# Patient Record
Sex: Female | Born: 1990 | Race: Black or African American | Hispanic: No | Marital: Married | Smoking: Never smoker
Health system: Southern US, Community
[De-identification: ages and names within clinical notes are randomized; demographics above are authoritative.]

---

## 2019-06-27 ENCOUNTER — Ambulatory Visit (HOSPITAL_COMMUNITY)
Admission: EM | Admit: 2019-06-27 | Discharge: 2019-06-27 | Disposition: A | Discharge status: Home / Self Care | Attending: Family Medicine | Admitting: Family Medicine

## 2019-06-27 ENCOUNTER — Other Ambulatory Visit: Payer: Self-pay

## 2019-06-27 ENCOUNTER — Encounter (HOSPITAL_COMMUNITY): Payer: Self-pay

## 2019-06-27 DIAGNOSIS — Z20828 Contact with and (suspected) exposure to other viral communicable diseases: Secondary | ICD-10-CM | POA: Insufficient documentation

## 2019-06-27 DIAGNOSIS — Z3202 Encounter for pregnancy test, result negative: Secondary | ICD-10-CM

## 2019-06-27 DIAGNOSIS — Z793 Long term (current) use of hormonal contraceptives: Secondary | ICD-10-CM | POA: Diagnosis not present

## 2019-06-27 DIAGNOSIS — R110 Nausea: Secondary | ICD-10-CM | POA: Insufficient documentation

## 2019-06-27 DIAGNOSIS — B9789 Other viral agents as the cause of diseases classified elsewhere: Secondary | ICD-10-CM | POA: Diagnosis not present

## 2019-06-27 DIAGNOSIS — R109 Unspecified abdominal pain: Secondary | ICD-10-CM | POA: Diagnosis not present

## 2019-06-27 DIAGNOSIS — Z791 Long term (current) use of non-steroidal anti-inflammatories (NSAID): Secondary | ICD-10-CM | POA: Insufficient documentation

## 2019-06-27 DIAGNOSIS — R0981 Nasal congestion: Secondary | ICD-10-CM | POA: Diagnosis present

## 2019-06-27 DIAGNOSIS — R05 Cough: Secondary | ICD-10-CM

## 2019-06-27 DIAGNOSIS — J069 Acute upper respiratory infection, unspecified: Secondary | ICD-10-CM | POA: Diagnosis not present

## 2019-06-27 DIAGNOSIS — Z79899 Other long term (current) drug therapy: Secondary | ICD-10-CM | POA: Insufficient documentation

## 2019-06-27 LAB — POCT URINALYSIS DIP (DEVICE)
Bilirubin Urine: NEGATIVE
Glucose, UA: NEGATIVE mg/dL
Hgb urine dipstick: NEGATIVE
Ketones, ur: NEGATIVE mg/dL
Leukocytes,Ua: NEGATIVE
Nitrite: NEGATIVE
Protein, ur: NEGATIVE mg/dL
Specific Gravity, Urine: >=1.03 (ref 1.005–1.030)
Urobilinogen, UA: 0.2 mg/dL (ref 0.0–1.0)
pH: 6 (ref 5.0–8.0)

## 2019-06-27 LAB — POCT PREGNANCY, URINE: Preg Test, Ur: NEGATIVE

## 2019-06-27 MED ORDER — NAPROXEN 500 MG PO TABS: 500.0000 mg | ORAL_TABLET | Freq: Two times a day (BID) | ORAL | 0 refills | Status: DC

## 2019-06-27 MED ORDER — FLUTICASONE PROPIONATE 50 MCG/ACT NA SUSP: 1.0000 | Freq: Every day | NASAL | 0 refills | Status: DC

## 2019-06-27 MED ORDER — CETIRIZINE HCL 10 MG PO CAPS: 10.0000 mg | ORAL_CAPSULE | Freq: Every day | ORAL | 0 refills | Status: DC

## 2019-06-27 MED ORDER — ONDANSETRON 4 MG PO TBDP: 4.0000 mg | ORAL_TABLET | Freq: Three times a day (TID) | ORAL | 0 refills | Status: DC | PRN

## 2019-06-27 NOTE — Discharge Instructions (Signed)
COVID swab pending Please begin daily cetirizine and Flonase nasal spray to help with sinus congestion pressure and ear discomfort Please use the above consistently over the next 1 to 2 weeks  Pregnancy test was negative Please follow-up with OB/GYN for further evaluation of cramping and nausea associated with birth control If symptoms progressing or worsening before follow-up appointment please follow-up here or the emergency room Please use Naprosyn as needed for cramping Zofran as needed for nausea

## 2019-06-27 NOTE — ED Provider Notes (Signed)
[facility]    CSN: [id number] Arrival date & time: 06/27/19  1615      History   Chief Complaint Chief Complaint  Patient presents with  . Appointment    1610  . Sinusitis  . Abdominal Pain    HPI Michelle Lucero is a 28 y.o. female no significant past medical history presenting today for evaluation of sinus congestion and cramping.  Patient states that over the past 2 to 3 days she has had nasal congestion, sinus pressure as well as ear discomfort.  She denies sore throat.  Has very infrequent cough.  Denies fevers chills or body aches.  Denies close contacts that have been sick or denies exposure to COVID.  She is also had some cramping over the past couple days.  Feels like menstrual cramping.  She has also had nausea.  She notes that since she started using NuvaRing approximately 5 to 6 months ago she started to have nausea in relation to this, but this month has been worse.  Patient is sexually active, denies any concerns for STDs.  Denies abnormal vaginal discharge.  Denies itching or irritation.  Denies urinary frequency urgency or hematuria.  Patient is due to start her menstrual cycle in a few days.  HPI  History reviewed. No pertinent past medical history.  There are no active problems to display for this patient.   History reviewed. No pertinent surgical history.  OB History   No obstetric history on file.      Home Medications    Prior to Admission medications   Medication Sig Start Date End Date Taking? Authorizing Provider  etonogestrel-ethinyl estradiol (NUVARING) 0.12-0.015 MG/24HR vaginal ring Place vaginally. 05/01/19 04/30/20 Yes [provider]  Cetirizine HCl 10 MG CAPS Take 1 capsule (10 mg total) by mouth daily. 06/27/19   Verl Whitmore C, PA-C  fluticasone (FLONASE) 50 MCG/ACT nasal spray Place 1-2 sprays into both nostrils daily for 14 days. 06/27/19 07/11/19  Jaymin Waln C, PA-C  naproxen (NAPROSYN) 500 MG tablet Take 1  tablet (500 mg total) by mouth 2 (two) times daily. 06/27/19   Navika Hoopes C, PA-C  ondansetron (ZOFRAN ODT) 4 MG disintegrating tablet Take 1 tablet (4 mg total) by mouth every 8 (eight) hours as needed for nausea or vomiting. 06/27/19   Kyrus Hyde, Junius CreamerHallie C, PA-C    Family History Family History  Problem Relation Age of Onset  . Healthy Mother   . Asthma Father   . Cancer Father   . Hypertension Father     Social History Social History   Tobacco Use  . Smoking status: Never Smoker  . Smokeless tobacco: Never Used  Substance Use Topics  . Alcohol use: Yes    Comment: rarely  . Drug use: Not on file     Allergies   Patient has no known allergies.   Review of Systems Review of Systems  Constitutional: Negative for activity change, appetite change, chills, fatigue and fever.  HENT: Positive for congestion, ear pain, rhinorrhea and sinus pressure. Negative for sore throat and trouble swallowing.   Eyes: Negative for discharge and redness.  Respiratory: Negative for cough, chest tightness and shortness of breath.   Cardiovascular: Negative for chest pain.  Gastrointestinal: Positive for abdominal pain and nausea. Negative for diarrhea and vomiting.  Genitourinary: Negative for dysuria, flank pain, genital sores, hematuria, menstrual problem, vaginal bleeding, vaginal discharge and vaginal pain.  Musculoskeletal: Negative for back pain and myalgias.  Skin: Negative for rash.  Neurological: Negative for dizziness, light-headedness and headaches.     Physical Exam Triage Vital Signs ED Triage Vitals  Enc Vitals Group     BP 06/27/19 1649 129/61     Pulse Rate 06/27/19 1649 85     Resp 06/27/19 1649 16     Temp 06/27/19 1649 98.5 F (36.9 C)     Temp Source 06/27/19 1649 Oral     SpO2 06/27/19 1649 100 %     Weight --      Height --      Head Circumference --      Peak Flow --      Pain Score 06/27/19 1645 0     Pain Loc --      Pain Edu? --      Excl. in GC?  --    No data found.  Updated Vital Signs BP 129/61 (BP Location: Right Arm)   Pulse 85   Temp 98.5 F (36.9 C) (Oral)   Resp 16   SpO2 100%   Visual Acuity Right Eye Distance:   Left Eye Distance:   Bilateral Distance:    Right Eye Near:   Left Eye Near:    Bilateral Near:     Physical Exam Vitals signs and nursing note reviewed.  Constitutional:      General: She is not in acute distress.    Appearance: She is well-developed.  HENT:     Head: Normocephalic and atraumatic.     Ears:     Comments: Bilateral ears without tenderness to palpation of external auricle, tragus and mastoid, EAC's without erythema or swelling, TM's with good bony landmarks and cone of light. Non erythematous.     Mouth/Throat:     Comments: Oral mucosa pink and moist, no tonsillar enlargement or exudate. Posterior pharynx patent and nonerythematous, no uvula deviation or swelling. Normal phonation.  Eyes:     Conjunctiva/sclera: Conjunctivae normal.  Neck:     Musculoskeletal: Neck supple.  Cardiovascular:     Rate and Rhythm: Normal rate and regular rhythm.     Heart sounds: No murmur.  Pulmonary:     Effort: Pulmonary effort is normal. No respiratory distress.     Breath sounds: Normal breath sounds.     Comments: Breathing comfortably at rest, CTABL, no wheezing, rales or other adventitious sounds auscultated Abdominal:     Palpations: Abdomen is soft.     Tenderness: There is no abdominal tenderness.     Comments: Soft, nondistended, nontender to light and deep palpation throughout entire abdomen, and negative rebound  Skin:    General: Skin is warm and dry.  Neurological:     Mental Status: She is alert.      UC Treatments / Results  Labs (all labs ordered are listed, but only abnormal results are displayed) Labs Reviewed  NOVEL CORONAVIRUS, NAA (HOSP ORDER, SEND-OUT TO REF LAB; TAT 18-24 HRS)  POC URINE PREG, ED  POCT URINALYSIS DIP (DEVICE)  POCT PREGNANCY, URINE     EKG   Radiology No results found.  Procedures Procedures (including critical care time)  Medications Ordered in UC Medications - No data to display  Initial Impression / Assessment and Plan / UC Course  I have reviewed the triage vital signs and the nursing notes.  Pertinent labs & imaging results that were available during my care of the patient were reviewed by me and considered in my medical decision making (see chart for details).     URI  symptoms x2 to 3 days, exam unremarkable, vital signs stable, most likely viral.  Recommending symptomatic and supportive care.  Providing Zyrtec and Flonase to help with congestion sinus pressure and ear discomfort.  COVID swab pending.  Cramping likely related to upcoming menstrual cycle/birth control.  No abdominal tenderness on exam, do not suspect abdominal emergency.  Will treat with Naprosyn and Zofran for nausea.  Pregnancy test negative, UA unremarkable.  Patient declined vaginal swab to check for STDs.  Patient has follow-up with OB/GYN early November with plans to discuss birth control side effects as well as cramping.  Discussed strict return precautions. Patient verbalized understanding and is agreeable with plan.  Final Clinical Impressions(s) / UC Diagnoses   Final diagnoses:  Viral URI with cough  Nausea  Abdominal cramping     Discharge Instructions     COVID swab pending Please begin daily cetirizine and Flonase nasal spray to help with sinus congestion pressure and ear discomfort Please use the above consistently over the next 1 to 2 weeks  Pregnancy test was negative Please follow-up with OB/GYN for further evaluation of cramping and nausea associated with birth control If symptoms progressing or worsening before follow-up appointment please follow-up here or the emergency room Please use Naprosyn as needed for cramping Zofran as needed for nausea   ED Prescriptions    Medication Sig Dispense Auth. Provider    Cetirizine HCl 10 MG CAPS Take 1 capsule (10 mg total) by mouth daily. 20 capsule Rikia Sukhu C, PA-C   fluticasone (FLONASE) 50 MCG/ACT nasal spray Place 1-2 sprays into both nostrils daily for 14 days. 1 g Kasidy Gianino C, PA-C   ondansetron (ZOFRAN ODT) 4 MG disintegrating tablet Take 1 tablet (4 mg total) by mouth every 8 (eight) hours as needed for nausea or vomiting. 20 tablet Gurbani Figge C, PA-C   naproxen (NAPROSYN) 500 MG tablet Take 1 tablet (500 mg total) by mouth 2 (two) times daily. 30 tablet Kyoko Elsea, [place] C, PA-C     PDMP not reviewed this encounter.   Janith Lima, [place] 06/27/19 1741

## 2019-06-27 NOTE — ED Triage Notes (Signed)
Patient presents to Urgent Care with complaints of sinus drainage and period-like cramps since 3-4 days ago. Patient reports she has been trying to make a GYN appt to evaluation her cramps, pt is on birth control (NuvaRing).

## 2019-06-29 LAB — NOVEL CORONAVIRUS, NAA (HOSP ORDER, SEND-OUT TO REF LAB; TAT 18-24 HRS): SARS-CoV-2, NAA: NOT DETECTED

## 2019-08-15 ENCOUNTER — Encounter (HOSPITAL_COMMUNITY): Payer: Self-pay

## 2019-08-15 ENCOUNTER — Ambulatory Visit (INDEPENDENT_AMBULATORY_CARE_PROVIDER_SITE_OTHER)

## 2019-08-15 ENCOUNTER — Other Ambulatory Visit: Payer: Self-pay

## 2019-08-15 ENCOUNTER — Ambulatory Visit (HOSPITAL_COMMUNITY)
Admission: EM | Admit: 2019-08-15 | Discharge: 2019-08-15 | Disposition: A | Discharge status: Home / Self Care | Attending: Urgent Care | Admitting: Urgent Care

## 2019-08-15 DIAGNOSIS — S93401A Sprain of unspecified ligament of right ankle, initial encounter: Secondary | ICD-10-CM

## 2019-08-15 DIAGNOSIS — M25571 Pain in right ankle and joints of right foot: Secondary | ICD-10-CM | POA: Diagnosis not present

## 2019-08-15 DIAGNOSIS — M79671 Pain in right foot: Secondary | ICD-10-CM

## 2019-08-15 MED ORDER — NAPROXEN 500 MG PO TABS: 500.0000 mg | ORAL_TABLET | Freq: Two times a day (BID) | ORAL | 0 refills | Status: DC

## 2019-08-15 NOTE — ED Provider Notes (Signed)
[facility]   MRN: [id number] DOB: [birth year]  Subjective:   Michelle Lucero is a 28 y.o. female presenting for 1 day hx of suffering right ankle sprain. She accidentally fell and twisted her ankle outwardly. Has a hx of [employer] injury of her right foot status post surgery, is scheduled to have follow-up with her orthopedist soon.   No current facility-administered medications for this encounter.   Current Outpatient Medications:  .  Cetirizine HCl 10 MG CAPS, Take 1 capsule (10 mg total) by mouth daily., Disp: 20 capsule, Rfl: 0 .  etonogestrel-ethinyl estradiol (NUVARING) 0.12-0.015 MG/24HR vaginal ring, Place vaginally., Disp: , Rfl:  .  fluticasone (FLONASE) 50 MCG/ACT nasal spray, Place 1-2 sprays into both nostrils daily for 14 days., Disp: 1 g, Rfl: 0 .  naproxen (NAPROSYN) 500 MG tablet, Take 1 tablet (500 mg total) by mouth 2 (two) times daily., Disp: 30 tablet, Rfl: 0 .  ondansetron (ZOFRAN ODT) 4 MG disintegrating tablet, Take 1 tablet (4 mg total) by mouth every 8 (eight) hours as needed for nausea or vomiting., Disp: 20 tablet, Rfl: 0   Allergies  Allergen Reactions  . Sesame Seed (Diagnostic) Itching  . Shellfish Allergy Itching and Nausea And Vomiting  . Soy Allergy Nausea Only    History reviewed. No pertinent past medical history.   History reviewed. No pertinent surgical history.  Family History  Problem Relation Age of Onset  . Healthy Mother   . Asthma Father   . Cancer Father   . Hypertension Father     Social History   Tobacco Use  . Smoking status: Never Smoker  . Smokeless tobacco: Never Used  Substance Use Topics  . Alcohol use: Yes    Comment: rarely  . Drug use: Not on file    ROS Denies dizziness, confusion, headache, head injury, vision change, nausea, vomiting, belly pain, difficulty with balance, bruising, bony deformity, laceration.  Objective:   Vitals: BP 119/80 (BP Location: Right Arm)   Pulse 91   Temp 98.4 F (36.9  C) (Oral)   Resp 16   LMP  (Within Days)   SpO2 96%   Physical Exam Constitutional:      General: She is not in acute distress.    Appearance: Normal appearance. She is well-developed. She is not ill-appearing, toxic-appearing or diaphoretic.  HENT:     Head: Normocephalic and atraumatic.     Nose: Nose normal.     Mouth/Throat:     Mouth: Mucous membranes are moist.     Pharynx: Oropharynx is clear.  Eyes:     General: No scleral icterus.    Extraocular Movements: Extraocular movements intact.     Pupils: Pupils are equal, round, and reactive to light.  Cardiovascular:     Rate and Rhythm: Normal rate.  Pulmonary:     Effort: Pulmonary effort is normal.  Musculoskeletal:     Right ankle: She exhibits decreased range of motion and swelling. She exhibits no ecchymosis, no deformity and no laceration. Tenderness. AITFL and head of 5th metatarsal (mild) tenderness found. Achilles tendon exhibits no pain, no defect and normal Thompson's test results.       Feet:  Skin:    General: Skin is warm and dry.  Neurological:     General: No focal deficit present.     Mental Status: She is alert and oriented to person, place, and time.     Cranial Nerves: No cranial nerve deficit.     Motor:  No weakness.     Coordination: Coordination abnormal (Limping favoring right ankle).     Deep Tendon Reflexes: Reflexes normal.  Psychiatric:        Mood and Affect: Mood normal.        Behavior: Behavior normal.        Thought Content: Thought content normal.        Judgment: Judgment normal.     Dg Ankle Complete Right  Result Date: 08/15/2019 CLINICAL DATA:  Twisting injury right ankle calming down stairs last night. Pain. Initial encounter. EXAM: RIGHT ANKLE - COMPLETE 3+ VIEW COMPARISON:  None. FINDINGS: There is no acute bony or joint abnormality. Mild to moderate degenerative change about the tarsometatarsal joints noted. Soft tissues unremarkable. IMPRESSION: No acute abnormality.  Electronically Signed   By: Inge Rise M.D.   On: 08/15/2019 12:23     Assessment and Plan :   1. Sprain of right ankle, unspecified ligament, initial encounter   2. Foot pain, right   3. Acute right ankle pain     Will manage for ankle sprain with rice method, NSAID. Patient requested crutches. Maintain close f/u with orthopedist. Counseled patient on potential for adverse effects with medications prescribed/recommended today, ER and return-to-clinic precautions discussed, patient verbalized understanding.    Jaynee Eagles, [place] 08/15/19 1232

## 2019-08-15 NOTE — ED Triage Notes (Signed)
Pt present to the UC with right foot pain x 1 day. Pt reports she fell going down the stairs and injured her right foot.

## 2019-09-11 ENCOUNTER — Other Ambulatory Visit: Payer: Self-pay

## 2019-09-11 ENCOUNTER — Ambulatory Visit (HOSPITAL_COMMUNITY)
Admission: EM | Admit: 2019-09-11 | Discharge: 2019-09-11 | Disposition: A | Discharge status: Home / Self Care | Attending: Family Medicine | Admitting: Family Medicine

## 2019-09-11 ENCOUNTER — Encounter (HOSPITAL_COMMUNITY): Payer: Self-pay

## 2019-09-11 DIAGNOSIS — R05 Cough: Secondary | ICD-10-CM | POA: Diagnosis not present

## 2019-09-11 DIAGNOSIS — J069 Acute upper respiratory infection, unspecified: Secondary | ICD-10-CM | POA: Insufficient documentation

## 2019-09-11 DIAGNOSIS — Z20822 Contact with and (suspected) exposure to covid-19: Secondary | ICD-10-CM

## 2019-09-11 DIAGNOSIS — Z20828 Contact with and (suspected) exposure to other viral communicable diseases: Secondary | ICD-10-CM

## 2019-09-11 DIAGNOSIS — U071 COVID-19: Secondary | ICD-10-CM | POA: Insufficient documentation

## 2019-09-11 MED ORDER — ALBUTEROL SULFATE HFA 108 (90 BASE) MCG/ACT IN AERS: 1.0000 | INHALATION_SPRAY | Freq: Four times a day (QID) | RESPIRATORY_TRACT | 0 refills | Status: AC | PRN

## 2019-09-11 NOTE — ED Triage Notes (Addendum)
Pt presenst to the UC for albuterol inhaler refill; COVID test Pt reports having cough, body aches and nasal congestion x 3 days.

## 2019-09-11 NOTE — ED Provider Notes (Signed)
[facility]    CSN: [id number] Arrival date & time: 09/11/19  1203      History   Chief Complaint Chief Complaint  Patient presents with  . Appointment    1200  . Medication Refill  . Generalized Body Aches  . Cough  . Nasal Congestion    HPI Michelle Lucero is a 28 y.o. female.   HPI  Patient has had 2 days of body aches, cough, nasal congestion.  She has asthma.  She needs a refill of her albuterol.  No wheezing or shortness of breath.  She does feel tired.  She states that her husband and parents are sick as well.  Her husband's fever was 101.  She is uncertain if anyone has a positive Covid test, but is concerned. She has not lost taste or smell.  Normal appetite.  Is not drinking very many fluids.  History reviewed. No pertinent past medical history.  There are no problems to display for this patient.   History reviewed. No pertinent surgical history.  OB History   No obstetric history on file.      Home Medications    Prior to Admission medications   Medication Sig Start Date End Date Taking? Authorizing Provider  etonogestrel-ethinyl estradiol (NUVARING) 0.12-0.015 MG/24HR vaginal ring Place vaginally. 05/01/19 04/30/20  [provider]  Cetirizine HCl 10 MG CAPS Take 1 capsule (10 mg total) by mouth daily. 06/27/19 08/15/19  Wieters, Hallie C, PA-C  fluticasone (FLONASE) 50 MCG/ACT nasal spray Place 1-2 sprays into both nostrils daily for 14 days. 06/27/19 08/15/19  Wieters, Junius Creamer, PA-C    Family History Family History  Problem Relation Age of Onset  . Healthy Mother   . Asthma Father   . Cancer Father   . Hypertension Father     Social History Social History   Tobacco Use  . Smoking status: Never Smoker  . Smokeless tobacco: Never Used  Substance Use Topics  . Alcohol use: Yes    Comment: rarely  . Drug use: Not on file     Allergies   Sesame seed (diagnostic), Shellfish allergy, and Soy allergy   Review of  Systems Review of Systems  Constitutional: Positive for fatigue. Negative for chills and fever.  HENT: Positive for congestion. Negative for hearing loss.   Eyes: Negative for pain.  Respiratory: Positive for cough and shortness of breath.   Cardiovascular: Negative for chest pain and leg swelling.  Gastrointestinal: Negative for abdominal pain, constipation and diarrhea.  Genitourinary: Negative for dysuria and frequency.  Musculoskeletal: Positive for myalgias.  Neurological: Negative for dizziness, seizures and headaches.  Psychiatric/Behavioral: The patient is not nervous/anxious.      Physical Exam  Triage Vital Signs ED Triage Vitals  Enc Vitals Group     BP 09/11/19 1322 121/88     Pulse Rate 09/11/19 1322 72     Resp 09/11/19 1322 16     Temp 09/11/19 1322 98.6 F (37 C)     Temp Source 09/11/19 1322 Oral     SpO2 09/11/19 1322 100 %     Weight --      Height --      Head Circumference --      Peak Flow --      Pain Score 09/11/19 1321 0     Pain Loc --      Pain Edu? --      Excl. in GC? --    No data found.  Updated  Vital Signs BP 121/88 (BP Location: Right Arm)   Pulse 72   Temp 98.6 F (37 C) (Oral)   Resp 16   LMP  (Within Weeks) Comment: 2 weeks  SpO2 100%     Physical Exam Constitutional:      General: She is not in acute distress.    Appearance: She is well-developed.     Comments: Patient is  overweight  HENT:     Head: Normocephalic and atraumatic.     Right Ear: Tympanic membrane and ear canal normal.     Left Ear: Tympanic membrane and ear canal normal.     Nose: Nose normal.     Mouth/Throat:     Mouth: Mucous membranes are dry.     Pharynx: No posterior oropharyngeal erythema.  Eyes:     Conjunctiva/sclera: Conjunctivae normal.     Pupils: Pupils are equal, round, and reactive to light.  Cardiovascular:     Rate and Rhythm: Normal rate and regular rhythm.     Heart sounds: Normal heart sounds.  Pulmonary:     Effort: Pulmonary  effort is normal. No respiratory distress.     Breath sounds: Normal breath sounds.     Comments: Lungs are clear Abdominal:     General: There is no distension.     Palpations: Abdomen is soft.  Musculoskeletal:        General: Normal range of motion.     Cervical back: Normal range of motion.  Lymphadenopathy:     Cervical: No cervical adenopathy.  Skin:    General: Skin is warm and dry.  Neurological:     Mental Status: She is alert.  Psychiatric:        Mood and Affect: Mood normal.        Behavior: Behavior normal.      UC Treatments / Results  Labs (all labs ordered are listed, but only abnormal results are displayed) Labs Reviewed  NOVEL CORONAVIRUS, NAA (HOSP ORDER, SEND-OUT TO REF LAB; TAT 18-24 HRS)    EKG   Radiology No results found.  Procedures Procedures (including critical care time)  Medications Ordered in UC Medications - No data to display  Initial Impression / Assessment and Plan / UC Course  I have reviewed the triage vital signs and the nursing notes.  Pertinent labs & imaging results that were available during my care of the patient were reviewed by me and considered in my medical decision making (see chart for details).    Reviewed possible coronavirus.  Signs and symptoms.  How to get test results.  Stay home. Final Clinical Impressions(s) / UC Diagnoses   Final diagnoses:  Viral upper respiratory tract infection  Suspected COVID-19 virus infection     Discharge Instructions     Rest Drink more water You may take tylenol for pain or fever May use OTC cough and cold medicine Check My Chart for test results   ED Prescriptions    None     PDMP not reviewed this encounter.   Raylene Everts, MD 09/11/19 1434

## 2019-09-11 NOTE — Discharge Instructions (Addendum)
Rest Drink more water You may take tylenol for pain or fever May use OTC cough and cold medicine Check My Chart for test results

## 2019-09-12 LAB — NOVEL CORONAVIRUS, NAA (HOSP ORDER, SEND-OUT TO REF LAB; TAT 18-24 HRS): SARS-CoV-2, NAA: DETECTED — AB

## 2019-09-15 ENCOUNTER — Telehealth (HOSPITAL_COMMUNITY): Payer: Self-pay | Admitting: Emergency Medicine

## 2019-09-15 NOTE — Telephone Encounter (Signed)

## 2021-07-27 IMAGING — DX DG ANKLE COMPLETE 3+V*R*
3 series · 3 of 3 positions shown · non-contrast
Comparison: None.

CLINICAL DATA: Twisting injury right ankle calming down stairs last
night. Pain. Initial encounter.

EXAM:
RIGHT ANKLE - COMPLETE 3+ VIEW

[ankle ap]
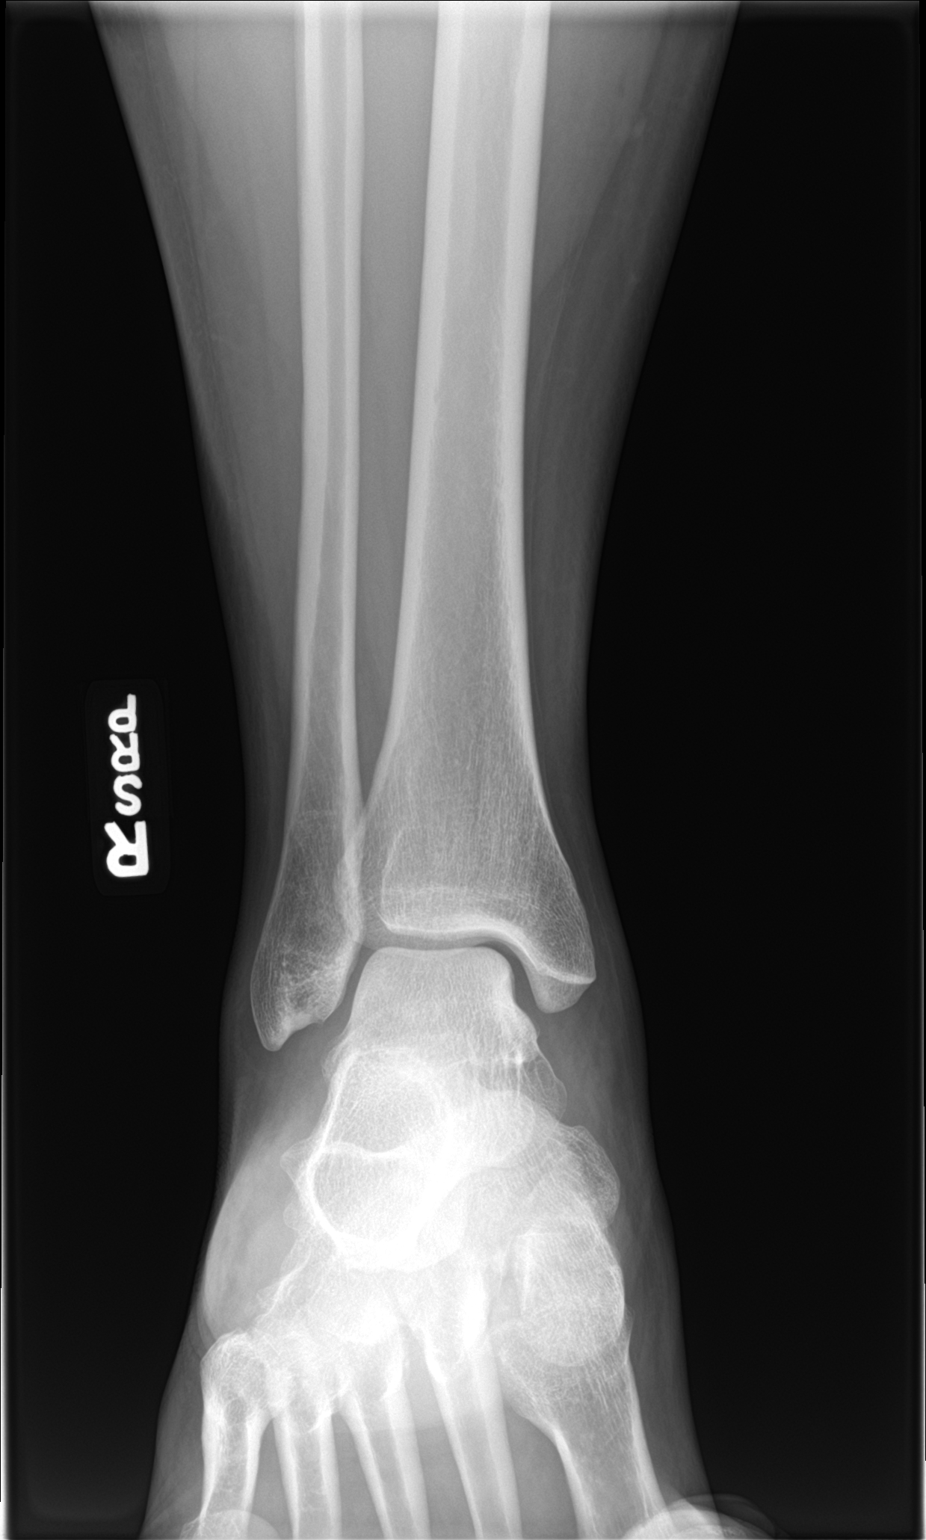

[ankle obl]
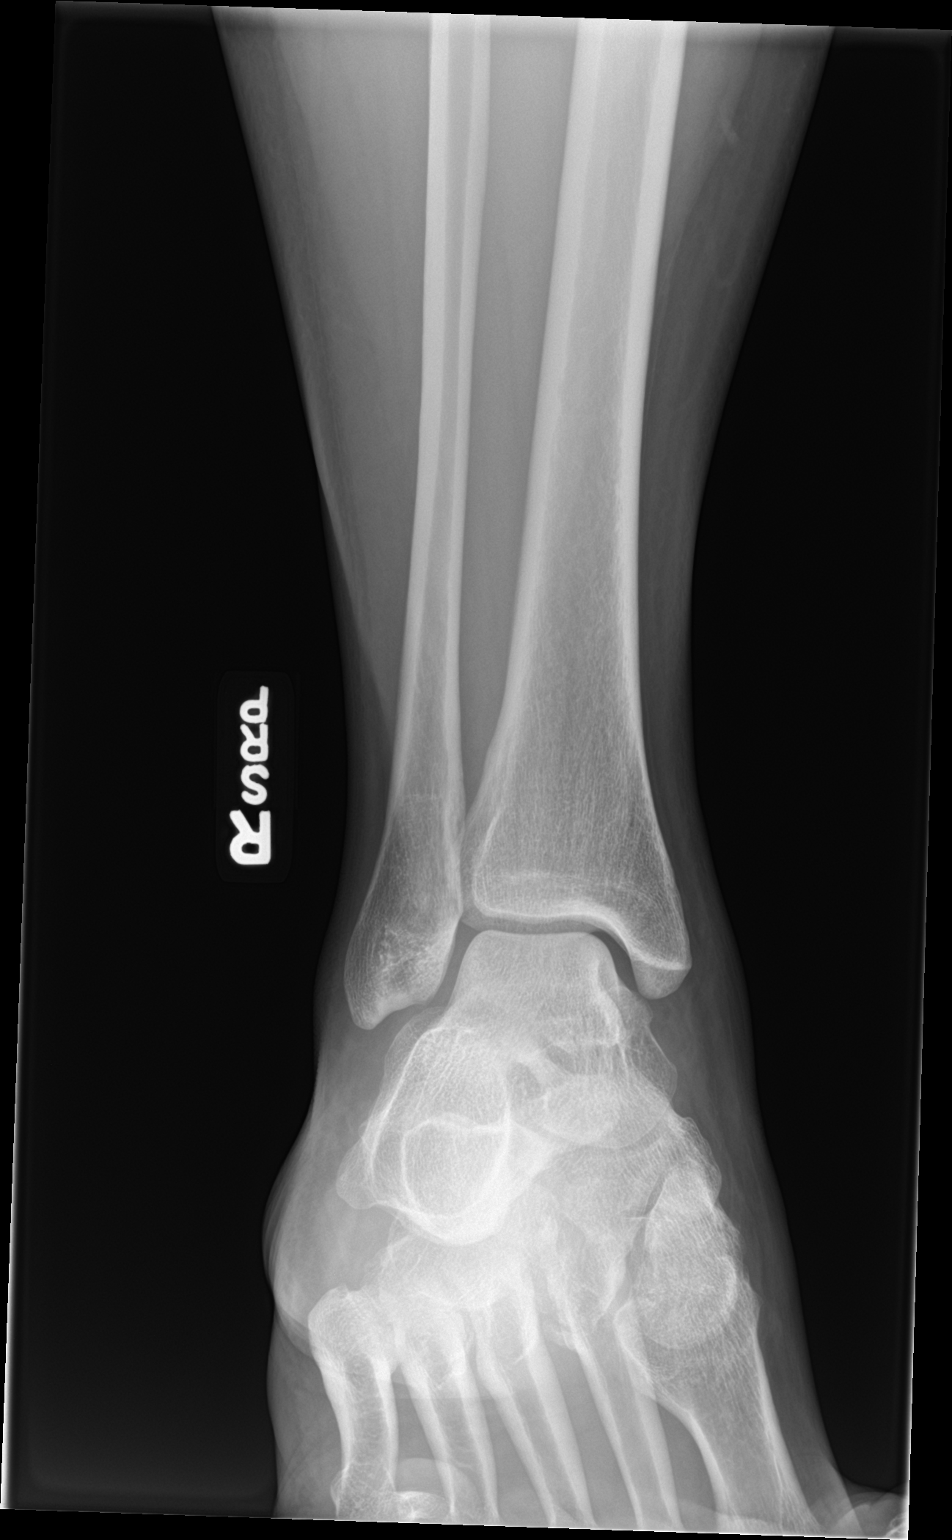

[ankle lat]
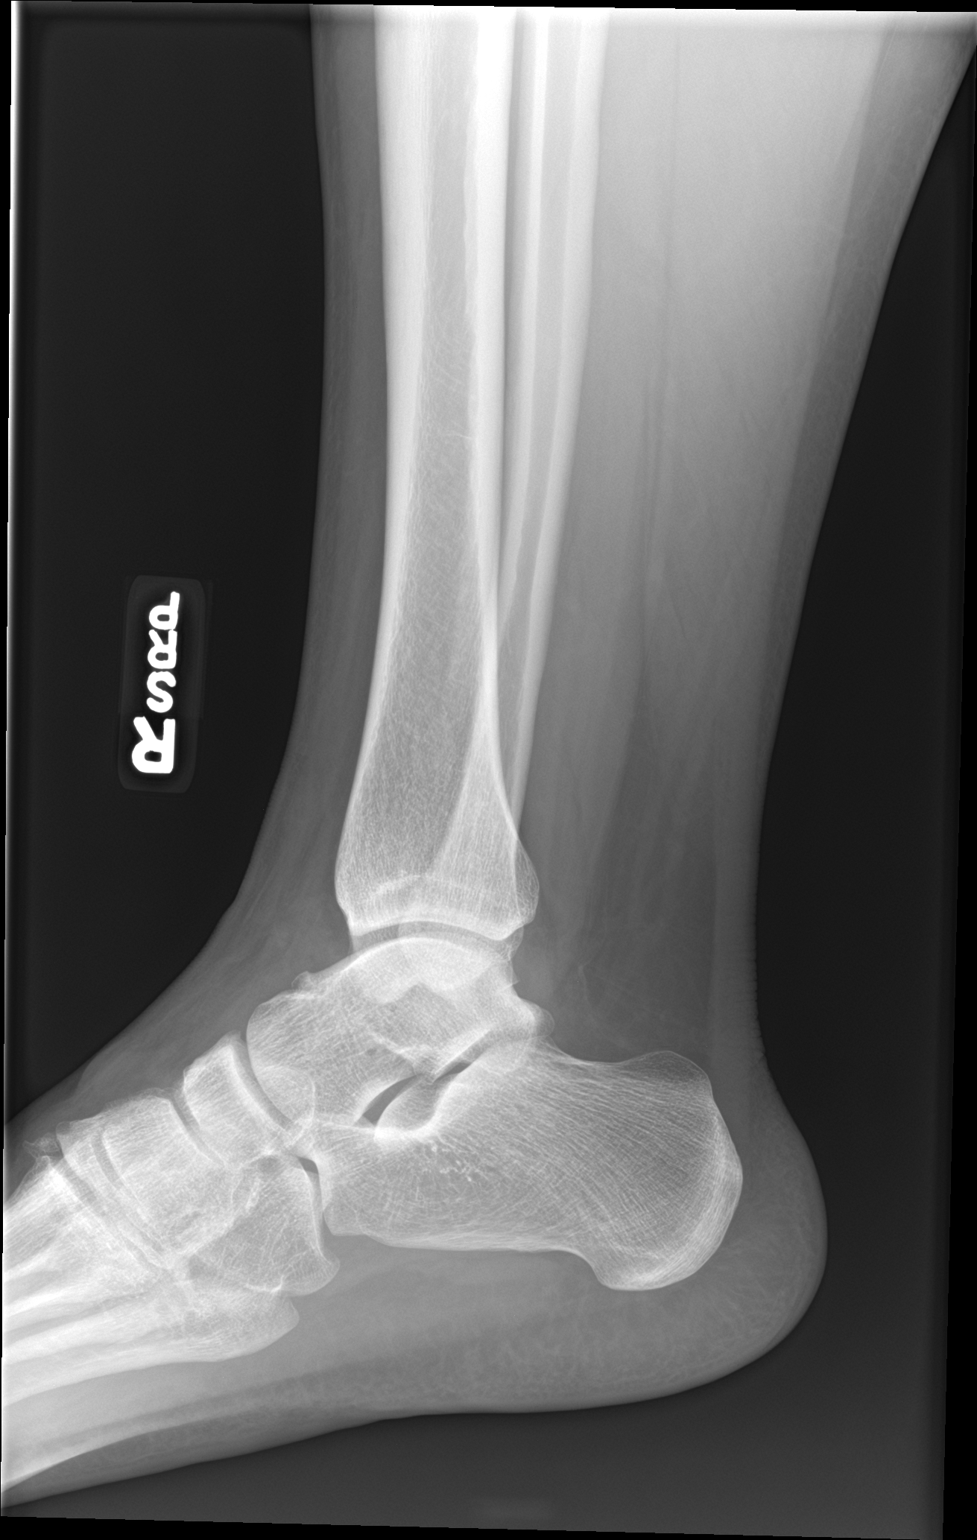

[3 of 3 positions shown; findings below may reference images not displayed]

FINDINGS: There is no acute bony or joint abnormality. Mild to moderate
degenerative change about the tarsometatarsal joints noted. Soft
tissues unremarkable.
IMPRESSION: No acute abnormality.
